# Patient Record
Sex: Male | Born: 1965 | Race: White | Hispanic: No | Marital: Married | Smoking: Never smoker
Health system: Southern US, Community
[De-identification: ages and names within clinical notes are randomized; demographics above are authoritative.]

## PROBLEM LIST (undated history)

## (undated) DIAGNOSIS — C4491 Basal cell carcinoma of skin, unspecified: Secondary | ICD-10-CM

## (undated) DIAGNOSIS — E079 Disorder of thyroid, unspecified: Secondary | ICD-10-CM

## (undated) DIAGNOSIS — E039 Hypothyroidism, unspecified: Secondary | ICD-10-CM

## (undated) DIAGNOSIS — T7840XA Allergy, unspecified, initial encounter: Secondary | ICD-10-CM

## (undated) HISTORY — DX: Hypothyroidism, unspecified: E03.9

## (undated) HISTORY — DX: Disorder of thyroid, unspecified: E07.9

## (undated) HISTORY — PX: NO PAST SURGERIES: SHX2092

## (undated) HISTORY — DX: Allergy, unspecified, initial encounter: T78.40XA

## (undated) HISTORY — DX: Basal cell carcinoma of skin, unspecified: C44.91

---

## 2007-07-06 ENCOUNTER — Encounter: Admission: RE | Admit: 2007-07-06 | Discharge: 2007-07-06 | Payer: Self-pay | Admitting: Orthopedic Surgery

## 2009-06-23 IMAGING — CR DG LUMBAR SPINE 2-3V
2 series · 2 of 2 positions shown · non-contrast
Comparison: none

CLINICAL DATA: Low back pain radiating to left leg. 
 LUMBAR SPINE ? 2 VIEW:

[view not recorded (1 of 2)]
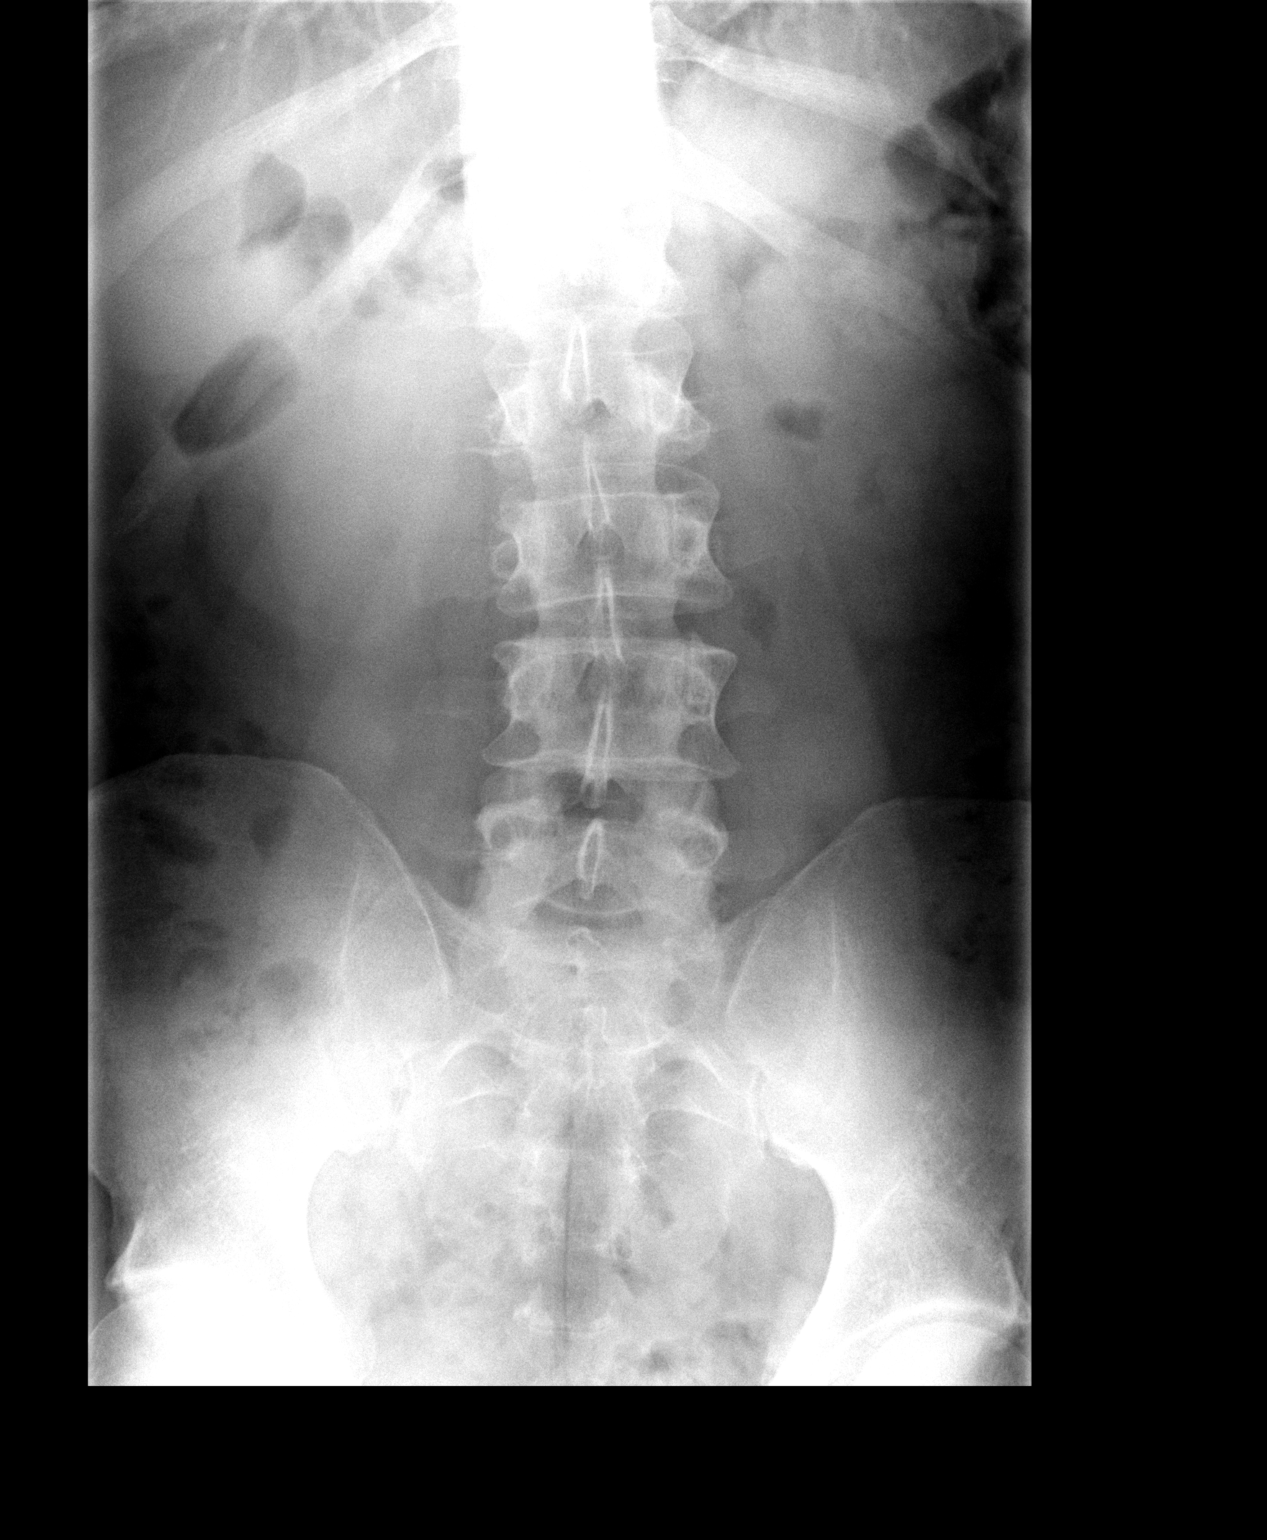

[view not recorded (2 of 2)]
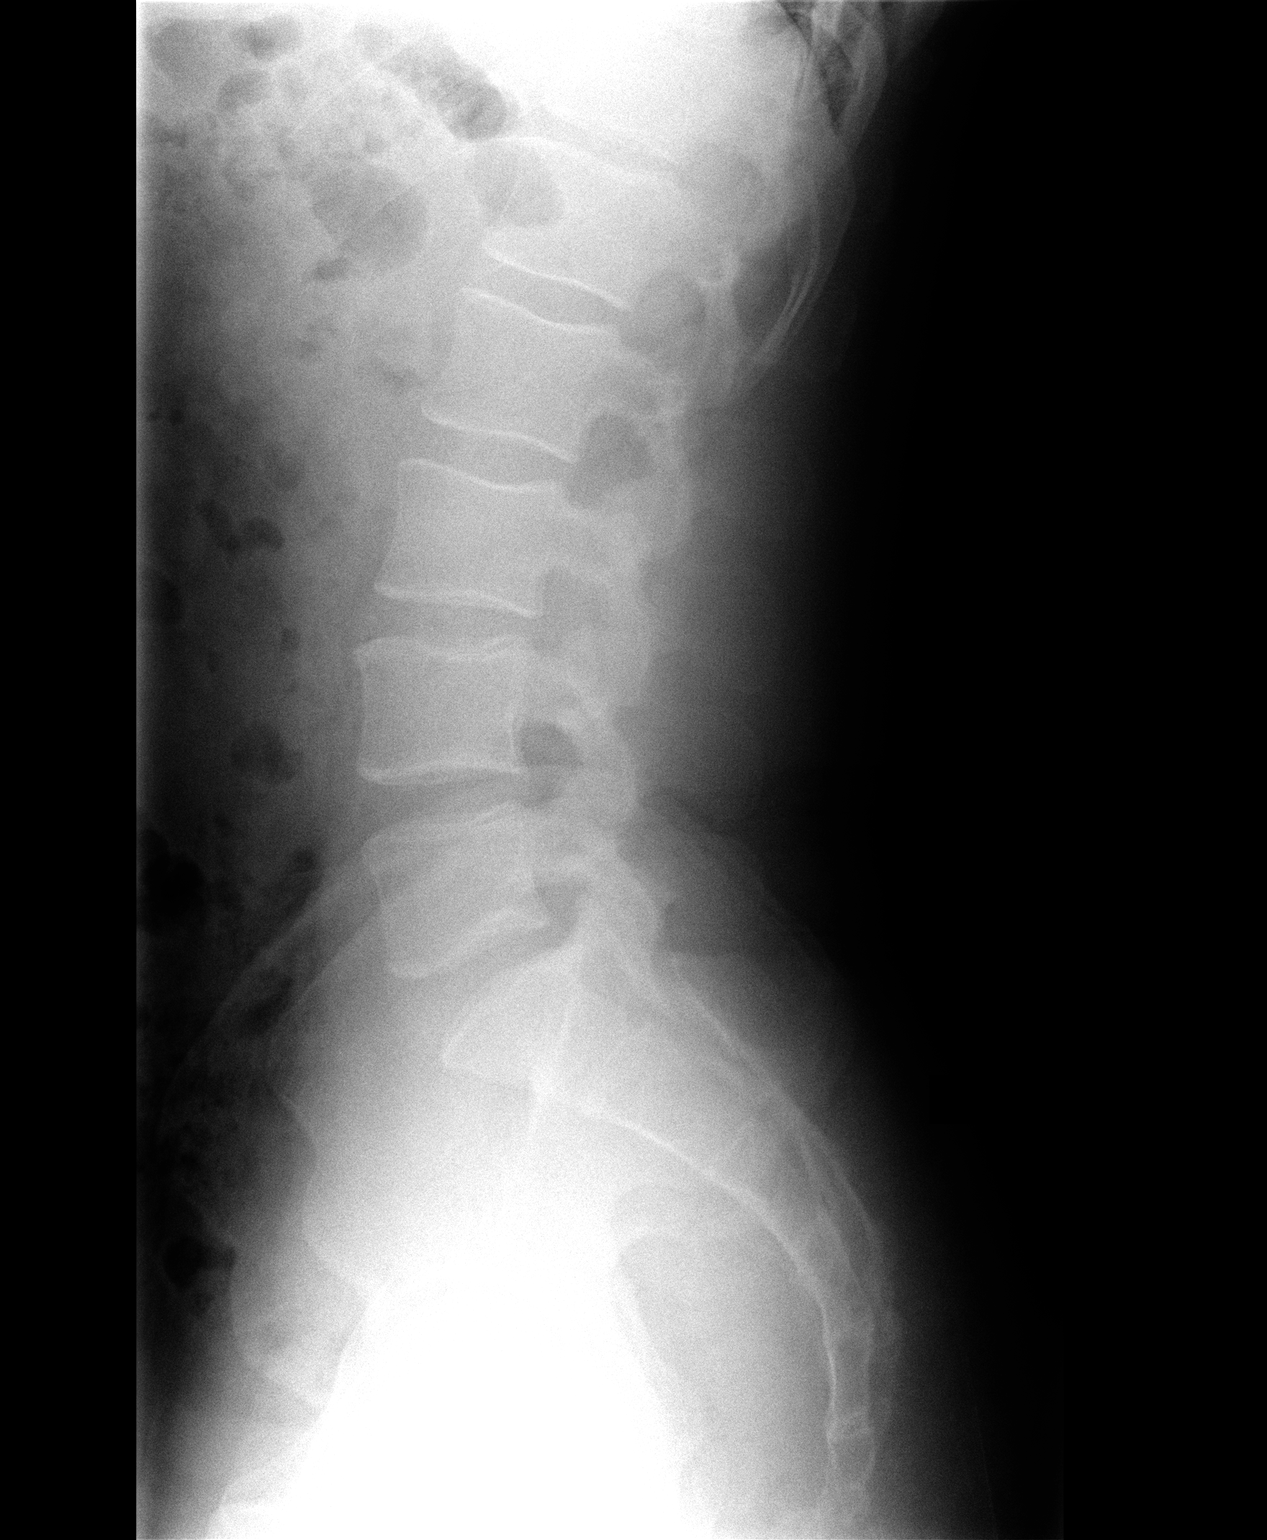

[2 of 2 positions shown; findings below may reference images not displayed]

FINDINGS: There is no evidence of acute lumbar spine fracture or subluxation.  Intervertebral disk spaces are maintained.  Mild lumbar levoscoliosis is noted.
IMPRESSION: 1.  No evidence of lumbar spine fracture or subluxation.   
 2.  Mild lumbar levoscoliosis.

## 2014-03-08 ENCOUNTER — Encounter: Payer: Self-pay | Admitting: Internal Medicine

## 2014-05-04 ENCOUNTER — Ambulatory Visit (AMBULATORY_SURGERY_CENTER): Payer: BC Managed Care – PPO | Admitting: *Deleted

## 2014-05-04 VITALS — Ht 69.0 in | Wt 175.2 lb

## 2014-05-04 DIAGNOSIS — Z1211 Encounter for screening for malignant neoplasm of colon: Secondary | ICD-10-CM

## 2014-05-04 NOTE — Progress Notes (Signed)
No egg or soy allergy. ewm No blood thinners, no diet pills. ewm Pt has never had sedation, no past surgeries. ewm Pt declined emmi video. ewm

## 2014-05-11 ENCOUNTER — Encounter: Payer: Self-pay | Admitting: Internal Medicine

## 2014-05-11 ENCOUNTER — Ambulatory Visit (AMBULATORY_SURGERY_CENTER): Payer: BC Managed Care – PPO | Admitting: Internal Medicine

## 2014-05-11 VITALS — BP 106/68 | HR 76 | Temp 98.5°F | Resp 25 | Ht 69.0 in | Wt 175.0 lb

## 2014-05-11 DIAGNOSIS — Z1211 Encounter for screening for malignant neoplasm of colon: Secondary | ICD-10-CM

## 2014-05-11 DIAGNOSIS — D123 Benign neoplasm of transverse colon: Secondary | ICD-10-CM

## 2014-05-11 DIAGNOSIS — K621 Rectal polyp: Secondary | ICD-10-CM

## 2014-05-11 MED ORDER — SODIUM CHLORIDE 0.9 % IV SOLN: 500.0000 mL | INTRAVENOUS | Status: DC

## 2014-05-11 NOTE — Patient Instructions (Addendum)
I found and removed one small polyp that looks benign. Everything else was normal with excellent prep.  I will let you know pathology results and when to have another routine colonoscopy by mail.  I appreciate the opportunity to care for you. Gatha Mayer, MD, [facility]   Discharge instructions given with verbal understanding. Handout on polyps. Resume previous medications. YOU HAD AN ENDOSCOPIC PROCEDURE TODAY AT [facility]: Refer to the procedure report that was given to you for any specific questions about what was found during the examination.  If the procedure report does not answer your questions, please call your gastroenterologist to clarify.  If you requested that your care partner not be given the details of your procedure findings, then the procedure report has been included in a sealed envelope for you to review at your convenience later.  YOU SHOULD EXPECT: Some feelings of bloating in the abdomen. Passage of more gas than usual.  Walking can help get rid of the air that was put into your GI tract during the procedure and reduce the bloating. If you had a lower endoscopy (such as a colonoscopy or flexible sigmoidoscopy) you may notice spotting of blood in your stool or on the toilet paper. If you underwent a bowel prep for your procedure, then you may not have a normal bowel movement for a few days.  DIET: Your first meal following the procedure should be a light meal and then it is ok to progress to your normal diet.  A half-sandwich or bowl of soup is an example of a good first meal.  Heavy or fried foods are harder to digest and may make you feel nauseous or bloated.  Likewise meals heavy in dairy and vegetables can cause extra gas to form and this can also increase the bloating.  Drink plenty of fluids but you should avoid alcoholic beverages for 24 hours.  ACTIVITY: Your care partner should take you home directly after the procedure.  You should plan to  take it easy, moving slowly for the rest of the day.  You can resume normal activity the day after the procedure however you should NOT DRIVE or use heavy machinery for 24 hours (because of the sedation medicines used during the test).    SYMPTOMS TO REPORT IMMEDIATELY: A gastroenterologist can be reached at any hour.  During normal business hours, 8:30 AM to 5:00 PM Monday through Friday, call [phone].  After hours and on weekends, please call the GI answering service at [phone] who will take a message and have the physician on call contact you.   Following lower endoscopy (colonoscopy or flexible sigmoidoscopy):  Excessive amounts of blood in the stool  Significant tenderness or worsening of abdominal pains  Swelling of the abdomen that is new, acute  Fever of 100F or higher FOLLOW UP: If any biopsies were taken you will be contacted by phone or by letter within the next 1-3 weeks.  Call your gastroenterologist if you have not heard about the biopsies in 3 weeks.  Our staff will call the home number listed on your records the next business day following your procedure to check on you and address any questions or concerns that you may have at that time regarding the information given to you following your procedure. This is a courtesy call and so if there is no answer at the home number and we have not heard from you through the emergency physician on call, we  will assume that you have returned to your regular daily activities without incident.  SIGNATURES/CONFIDENTIALITY: You and/or your care partner have signed paperwork which will be entered into your electronic medical record.  These signatures attest to the fact that that the information above on your After Visit Summary has been reviewed and is understood.  Full responsibility of the confidentiality of this discharge information lies with you and/or your care-partner.

## 2014-05-11 NOTE — Progress Notes (Signed)
Report to PACU, RN, vss, BBS= Clear.  

## 2014-05-11 NOTE — Op Note (Signed)
[place]  [address] [place], [id number]   COLONOSCOPY PROCEDURE REPORT  PATIENT: Jay, Hernandez  MR#: [id number] BIRTHDATE: 04-21-1966 , 48  yrs. old GENDER: male ENDOSCOPIST: Gatha Mayer, MD, [facility] PROCEDURE DATE:  05/11/2014 PROCEDURE:   Colonoscopy with snare polypectomy First Screening Colonoscopy - Avg.  risk and is 50 yrs.  old or older Yes.  Prior Negative Screening - Now for repeat screening. N/A  History of Adenoma - Now for follow-up colonoscopy & has been > or = to 3 yrs.  N/A  Polyps Removed Today? Yes. ASA CLASS:   Class II INDICATIONS:average risk for colorectal cancer and first colonoscopy. MEDICATIONS: Propofol 260 mg IV and Monitored anesthesia care  DESCRIPTION OF PROCEDURE:   After the risks benefits and alternatives of the procedure were thoroughly explained, informed consent was obtained.  The digital rectal exam revealed no abnormalities of the rectum, revealed the prostate was not enlarged, and revealed no prostatic nodules.   The LB [id number]  endoscope was introduced through the anus and advanced to the cecum, which was identified by both the appendix and ileocecal valve. No adverse events experienced.   The quality of the prep was excellent, using MiraLax  The instrument was then slowly withdrawn as the colon was fully examined.      COLON FINDINGS: A sessile polyp measuring 4 mm in size was found at the splenic flexure.  A polypectomy was performed with a cold snare.  The resection was complete, the polyp tissue was completely retrieved and sent to histology.   The examination was otherwise normal.   Right colon retroflexion included.  Retroflexed views revealed no abnormalities. The time to cecum=3 minutes 22 seconds. Withdrawal time=11 minutes 53 seconds.  The scope was withdrawn and the procedure completed. COMPLICATIONS: There were no immediate complications.  ENDOSCOPIC IMPRESSION: 1.   Sessile polyp (39mm)  was found at the splenic flexure; polypectomy was performed with a cold snare 2.   The examination was otherwise normal - excellent prep - first screening  RECOMMENDATIONS: Timing of repeat colonoscopy will be determined by pathology findings.  eSigned:  Gatha Mayer, MD, [facility] 05/11/2014 8:57 AM   cc: Soyla Murphy, MD and The Patient

## 2014-05-11 NOTE — Progress Notes (Signed)
Called to room to assist during endoscopic procedure.  Patient ID and intended procedure confirmed with present staff. Received instructions for my participation in the procedure from the performing physician.  

## 2014-05-12 ENCOUNTER — Telehealth: Payer: Self-pay | Admitting: *Deleted

## 2014-05-12 NOTE — Telephone Encounter (Signed)
  Follow up Call-  Call back number 05/11/2014  Post procedure Call Back phone  # [phone]  Permission to leave phone message Yes     Patient questions:  Do you have a fever, pain , or abdominal swelling? No. Pain Score  0 *  Have you tolerated food without any problems? Yes.    Have you been able to return to your normal activities? Yes.    Do you have any questions about your discharge instructions: Diet   No. Medications  No. Follow up visit  No.  Do you have questions or concerns about your Care? No.  Actions: * If pain score is 4 or above: No action needed, pain <4.

## 2014-05-13 ENCOUNTER — Telehealth: Payer: Self-pay | Admitting: Internal Medicine

## 2014-05-13 NOTE — Telephone Encounter (Signed)
Agree with assessment and recommendations.

## 2014-05-13 NOTE — Telephone Encounter (Signed)
Patient broke out this am with hives and had swelling in his tongue.  His procedure was on Tuesday.  No problems until this am.  His is advised that highly unlikely to be from the propofol or the prep this many days later.  He is advised to check for new soaps, lotions, washing powders etc.  He reports he has had hives before.  He does relay drinking coffee from an office he was at this am. He took two benadryl prior to calling.  He is advised to continue benadryl every 4-6 hours until hives are gone and encouraged to follow up with his primary care.  He verbalized understanding.  The swelling in his tongue is now gone and he is feeling a little better.

## 2014-05-20 ENCOUNTER — Encounter: Payer: Self-pay | Admitting: Internal Medicine

## 2014-05-20 NOTE — Progress Notes (Signed)
Quick Note:  Benign mucosal polyp Recall colon 2025 ______

## 2015-01-25 ENCOUNTER — Other Ambulatory Visit: Payer: Self-pay | Admitting: Physician Assistant

## 2015-01-25 DIAGNOSIS — C4491 Basal cell carcinoma of skin, unspecified: Secondary | ICD-10-CM

## 2015-01-25 HISTORY — DX: Basal cell carcinoma of skin, unspecified: C44.91

## 2015-02-16 ENCOUNTER — Other Ambulatory Visit: Payer: Self-pay | Admitting: Physician Assistant

## 2020-11-28 ENCOUNTER — Other Ambulatory Visit: Payer: Self-pay

## 2020-11-28 ENCOUNTER — Ambulatory Visit: Payer: Self-pay | Admitting: Physician Assistant

## 2020-11-28 ENCOUNTER — Encounter: Payer: Self-pay | Admitting: Physician Assistant

## 2020-11-28 ENCOUNTER — Ambulatory Visit: Payer: BC Managed Care – PPO | Admitting: Physician Assistant

## 2020-11-28 DIAGNOSIS — L820 Inflamed seborrheic keratosis: Secondary | ICD-10-CM

## 2020-11-28 DIAGNOSIS — Z1283 Encounter for screening for malignant neoplasm of skin: Secondary | ICD-10-CM | POA: Diagnosis not present

## 2020-11-28 DIAGNOSIS — Z85828 Personal history of other malignant neoplasm of skin: Secondary | ICD-10-CM | POA: Diagnosis not present

## 2020-11-28 DIAGNOSIS — L570 Actinic keratosis: Secondary | ICD-10-CM | POA: Diagnosis not present

## 2020-11-28 MED ORDER — IMIQUIMOD 5 % EX CREA: TOPICAL_CREAM | Freq: Every day | CUTANEOUS | 1 refills | Status: AC

## 2020-11-28 NOTE — Progress Notes (Signed)
   Follow-Up Visit   Subjective  Jay Hernandez is a 55 y.o. male who presents for the following: Annual Exam (Patient has history of bcc nod. Ant scalp, no new concerns). Patient had an allergic reaction it the past.   The following portions of the chart were reviewed this encounter and updated as appropriate:  Tobacco  Allergies  Meds  Problems  Med Hx  Surg Hx  Fam Hx      Objective  Well appearing patient in no apparent distress; mood and affect are within normal limits.  A full examination was performed including scalp, head, eyes, ears, nose, lips, neck, chest, axillae, abdomen, back, buttocks, bilateral upper extremities, bilateral lower extremities, hands, feet, fingers, toes, fingernails, and toenails. All findings within normal limits unless otherwise noted below.  Objective  crown Scalp (4): Erythematous patches with gritty scale.  Objective  Left Supraclavicular Area (6), Right Supraclavicular Area (5): Erythematous stuck-on, waxy plaques.    Assessment & Plan  AK (actinic keratosis) (4) crown Scalp  Patient will use imiquimod on his scalp diffuse scale after he heals from the LN2.    imiquimod (ALDARA) 5 % cream - crown Scalp  Destruction of lesion - crown Scalp Complexity: simple   Destruction method: cryotherapy   Informed consent: discussed and consent obtained   Timeout:  patient name, date of birth, surgical site, and procedure verified Lesion destroyed using liquid nitrogen: Yes   Cryotherapy cycles:  1 Outcome: patient tolerated procedure well with no complications   Post-procedure details: wound care instructions given    Inflamed seborrheic keratosis (11) Left Supraclavicular Area (6); Right Supraclavicular Area (5)  Destruction of lesion - Left Supraclavicular Area, Right Supraclavicular Area Complexity: simple   Destruction method: cryotherapy   Informed consent: discussed and consent obtained   Timeout:  patient name, date of birth,  surgical site, and procedure verified Lesion destroyed using liquid nitrogen: Yes   Cryotherapy cycles:  1 Outcome: patient tolerated procedure well with no complications   Post-procedure details: wound care instructions given      I, Arrin Ishler, PA-C, have reviewed all documentation's for this visit.  The documentation on 11/28/20 for the exam, diagnosis, procedures and orders are all accurate and complete.

## 2021-02-13 ENCOUNTER — Ambulatory Visit: Payer: Self-pay | Admitting: Physician Assistant

## 2022-01-22 ENCOUNTER — Ambulatory Visit: Payer: BC Managed Care – PPO | Admitting: Physician Assistant

## 2022-02-07 ENCOUNTER — Ambulatory Visit: Payer: BC Managed Care – PPO | Admitting: Physician Assistant

## 2023-12-12 ENCOUNTER — Telehealth (INDEPENDENT_AMBULATORY_CARE_PROVIDER_SITE_OTHER): Payer: Self-pay

## 2023-12-12 NOTE — Telephone Encounter (Signed)
 Patient is requesting refills on his Flonase

## 2024-07-22 ENCOUNTER — Encounter: Payer: Self-pay | Admitting: Internal Medicine
